# Patient Record
Sex: Male | Born: 1970 | Race: White | Hispanic: No | Marital: Married | Smoking: Former smoker
Health system: Southern US, Community
[De-identification: ages and names within clinical notes are randomized; demographics above are authoritative.]

## PROBLEM LIST (undated history)

## (undated) DIAGNOSIS — G43909 Migraine, unspecified, not intractable, without status migrainosus: Secondary | ICD-10-CM

## (undated) DIAGNOSIS — M5030 Other cervical disc degeneration, unspecified cervical region: Secondary | ICD-10-CM

## (undated) DIAGNOSIS — M4310 Spondylolisthesis, site unspecified: Secondary | ICD-10-CM

---

## 1998-07-08 ENCOUNTER — Emergency Department (HOSPITAL_COMMUNITY): Admission: EM | Admit: 1998-07-08 | Discharge: 1998-07-08 | Payer: Self-pay | Admitting: Emergency Medicine

## 2002-03-05 ENCOUNTER — Ambulatory Visit (HOSPITAL_COMMUNITY): Admission: RE | Admit: 2002-03-05 | Discharge: 2002-03-05 | Payer: Self-pay | Admitting: Internal Medicine

## 2002-03-05 ENCOUNTER — Encounter: Payer: Self-pay | Admitting: Internal Medicine

## 2002-10-18 ENCOUNTER — Emergency Department (HOSPITAL_COMMUNITY): Admission: EM | Admit: 2002-10-18 | Discharge: 2002-10-18 | Payer: Self-pay | Admitting: Emergency Medicine

## 2003-01-19 ENCOUNTER — Encounter: Admission: RE | Admit: 2003-01-19 | Discharge: 2003-01-19 | Payer: Self-pay | Admitting: Internal Medicine

## 2003-01-19 ENCOUNTER — Encounter: Payer: Self-pay | Admitting: Internal Medicine

## 2003-11-12 ENCOUNTER — Emergency Department (HOSPITAL_COMMUNITY): Admission: EM | Admit: 2003-11-12 | Discharge: 2003-11-13 | Payer: Self-pay | Admitting: Emergency Medicine

## 2004-06-30 ENCOUNTER — Ambulatory Visit: Payer: Self-pay | Admitting: Internal Medicine

## 2008-09-25 ENCOUNTER — Emergency Department (HOSPITAL_COMMUNITY): Admission: EM | Admit: 2008-09-25 | Discharge: 2008-09-25 | Payer: Self-pay | Admitting: *Deleted

## 2008-11-04 ENCOUNTER — Ambulatory Visit: Payer: Self-pay

## 2009-02-19 ENCOUNTER — Emergency Department: Payer: Self-pay | Admitting: Emergency Medicine

## 2009-06-23 ENCOUNTER — Emergency Department (HOSPITAL_COMMUNITY): Admission: EM | Admit: 2009-06-23 | Discharge: 2009-06-23 | Payer: Self-pay | Admitting: Emergency Medicine

## 2010-01-30 ENCOUNTER — Ambulatory Visit: Payer: Self-pay | Admitting: Neurosurgery

## 2010-11-05 ENCOUNTER — Emergency Department: Payer: Self-pay | Admitting: Emergency Medicine

## 2010-11-14 ENCOUNTER — Emergency Department: Payer: Self-pay | Admitting: Emergency Medicine

## 2010-11-17 ENCOUNTER — Ambulatory Visit: Payer: Self-pay | Admitting: Orthopaedic Surgery

## 2010-12-02 LAB — DIFFERENTIAL
Basophils Absolute: 0 10*3/uL (ref 0.0–0.1)
Basophils Relative: 1 % (ref 0–1)
Eosinophils Absolute: 0.2 10*3/uL (ref 0.0–0.7)
Eosinophils Relative: 3 % (ref 0–5)
Lymphocytes Relative: 29 % (ref 12–46)
Lymphs Abs: 2 10*3/uL (ref 0.7–4.0)
Monocytes Absolute: 0.5 10*3/uL (ref 0.1–1.0)
Monocytes Relative: 8 % (ref 3–12)
Neutro Abs: 4.2 10*3/uL (ref 1.7–7.7)
Neutrophils Relative %: 61 % (ref 43–77)

## 2010-12-02 LAB — CBC
HCT: 46.7 % (ref 39.0–52.0)
Hemoglobin: 16.3 g/dL (ref 13.0–17.0)
MCHC: 34.8 g/dL (ref 30.0–36.0)
MCV: 100.3 fL — ABNORMAL HIGH (ref 78.0–100.0)
Platelets: 199 10*3/uL (ref 150–400)
RBC: 4.66 MIL/uL (ref 4.22–5.81)
RDW: 12.7 % (ref 11.5–15.5)
WBC: 7 10*3/uL (ref 4.0–10.5)

## 2010-12-02 LAB — POCT I-STAT, CHEM 8
BUN: 9 mg/dL (ref 6–23)
Calcium, Ion: 1.12 mmol/L (ref 1.12–1.32)
Chloride: 103 mEq/L (ref 96–112)
Creatinine, Ser: 1.1 mg/dL (ref 0.4–1.5)
Glucose, Bld: 94 mg/dL (ref 70–99)
HCT: 50 % (ref 39.0–52.0)
Hemoglobin: 17 g/dL (ref 13.0–17.0)
Potassium: 3.9 mEq/L (ref 3.5–5.1)
Sodium: 140 mEq/L (ref 135–145)
TCO2: 28 mmol/L (ref 0–100)

## 2010-12-02 LAB — SEDIMENTATION RATE: Sed Rate: 2 mm/hr (ref 0–16)

## 2012-06-06 ENCOUNTER — Ambulatory Visit: Payer: Self-pay | Admitting: Pain Medicine

## 2012-06-06 ENCOUNTER — Ambulatory Visit: Payer: Self-pay

## 2014-02-18 ENCOUNTER — Emergency Department (HOSPITAL_COMMUNITY): Payer: 59

## 2014-02-18 ENCOUNTER — Emergency Department (HOSPITAL_COMMUNITY)
Admission: EM | Admit: 2014-02-18 | Discharge: 2014-02-18 | Disposition: A | Discharge status: Home / Self Care | Payer: 59 | Attending: Emergency Medicine | Admitting: Emergency Medicine

## 2014-02-18 ENCOUNTER — Encounter (HOSPITAL_COMMUNITY): Payer: Self-pay | Admitting: Emergency Medicine

## 2014-02-18 DIAGNOSIS — Z79899 Other long term (current) drug therapy: Secondary | ICD-10-CM | POA: Insufficient documentation

## 2014-02-18 DIAGNOSIS — F17200 Nicotine dependence, unspecified, uncomplicated: Secondary | ICD-10-CM | POA: Insufficient documentation

## 2014-02-18 DIAGNOSIS — IMO0002 Reserved for concepts with insufficient information to code with codable children: Secondary | ICD-10-CM | POA: Insufficient documentation

## 2014-02-18 DIAGNOSIS — S0990XA Unspecified injury of head, initial encounter: Secondary | ICD-10-CM

## 2014-02-18 DIAGNOSIS — Y9389 Activity, other specified: Secondary | ICD-10-CM | POA: Insufficient documentation

## 2014-02-18 DIAGNOSIS — M5030 Other cervical disc degeneration, unspecified cervical region: Secondary | ICD-10-CM | POA: Insufficient documentation

## 2014-02-18 DIAGNOSIS — W010XXA Fall on same level from slipping, tripping and stumbling without subsequent striking against object, initial encounter: Secondary | ICD-10-CM | POA: Insufficient documentation

## 2014-02-18 DIAGNOSIS — S161XXA Strain of muscle, fascia and tendon at neck level, initial encounter: Secondary | ICD-10-CM

## 2014-02-18 DIAGNOSIS — G43909 Migraine, unspecified, not intractable, without status migrainosus: Secondary | ICD-10-CM | POA: Insufficient documentation

## 2014-02-18 DIAGNOSIS — Y9289 Other specified places as the place of occurrence of the external cause: Secondary | ICD-10-CM | POA: Insufficient documentation

## 2014-02-18 DIAGNOSIS — S139XXA Sprain of joints and ligaments of unspecified parts of neck, initial encounter: Secondary | ICD-10-CM | POA: Insufficient documentation

## 2014-02-18 HISTORY — DX: Migraine, unspecified, not intractable, without status migrainosus: G43.909

## 2014-02-18 HISTORY — DX: Other cervical disc degeneration, unspecified cervical region: M50.30

## 2014-02-18 MED ORDER — CYCLOBENZAPRINE HCL 10 MG PO TABS: 10.0000 mg | ORAL_TABLET | Freq: Two times a day (BID) | ORAL | Status: DC | PRN

## 2014-02-18 MED ORDER — HYDROMORPHONE HCL PF 1 MG/ML IJ SOLN
1.0000 mg | Freq: Once | INTRAMUSCULAR | Status: AC
Start: 2014-02-18 — End: 2014-02-18
  Administered 2014-02-18: 1 mg via INTRAVENOUS
  Filled 2014-02-18: qty 1

## 2014-02-18 MED ORDER — HYDROMORPHONE HCL PF 1 MG/ML IJ SOLN
0.5000 mg | Freq: Once | INTRAMUSCULAR | Status: AC
  Administered 2014-02-18: 0.5 mg via INTRAVENOUS
  Filled 2014-02-18: qty 1

## 2014-02-18 MED ORDER — OXYCODONE-ACETAMINOPHEN 5-325 MG PO TABS: 1.0000 | ORAL_TABLET | ORAL | Status: AC | PRN

## 2014-02-18 MED ORDER — OXYCODONE-ACETAMINOPHEN 5-325 MG PO TABS: 1.0000 | ORAL_TABLET | ORAL | Status: DC | PRN

## 2014-02-18 MED ORDER — CYCLOBENZAPRINE HCL 10 MG PO TABS: 10.0000 mg | ORAL_TABLET | Freq: Two times a day (BID) | ORAL | Status: AC | PRN

## 2014-02-18 MED ORDER — KETOROLAC TROMETHAMINE 30 MG/ML IJ SOLN
30.0000 mg | Freq: Once | INTRAMUSCULAR | Status: AC
  Administered 2014-02-18: 30 mg via INTRAVENOUS
  Filled 2014-02-18: qty 1

## 2014-02-18 NOTE — ED Notes (Signed)
Pt returned from x-ray, placed back on cardiac monitor, b/p and pulse ox.  Wife at bedside.

## 2014-02-18 NOTE — ED Notes (Signed)
CT notified, pt available for transport.

## 2014-02-18 NOTE — ED Notes (Signed)
No reaction from IV meds, Pt discharged via wheelchair home with wife.

## 2014-02-18 NOTE — Discharge Instructions (Signed)
Take pain medications as prescribed. Stretch. Ice and heat. Your x-rays and CTs are all normal today. Please followup with a primary care doctor on Friday as scheduled. Return if any worsening symptoms, numbness or weakness in extremities, any new concerning symptoms.   Cervical Sprain A cervical sprain is an injury in the neck in which the strong, fibrous tissues (ligaments) that connect your neck bones stretch or tear. Cervical sprains can range from mild to severe. Severe cervical sprains can cause the neck vertebrae to be unstable. This can lead to damage of the spinal cord and can result in serious nervous system problems. The amount of time it takes for a cervical sprain to get better depends on the cause and extent of the injury. Most cervical sprains heal in 1 to 3 weeks. CAUSES  Severe cervical sprains may be caused by:   Contact sport injuries (such as from football, rugby, wrestling, hockey, auto racing, gymnastics, diving, martial arts, or boxing).   Motor vehicle collisions.   Whiplash injuries. This is an injury from a sudden forward and backward whipping movement of the head and neck.  Falls.  Mild cervical sprains may be caused by:   Being in an awkward position, such as while cradling a telephone between your ear and shoulder.   Sitting in a chair that does not offer proper support.   Working at a poorly Landscape architect station.   Looking up or down for long periods of time.  SYMPTOMS   Pain, soreness, stiffness, or a burning sensation in the front, back, or sides of the neck. This discomfort may develop immediately after the injury or slowly, 24 hours or more after the injury.   Pain or tenderness directly in the middle of the back of the neck.   Shoulder or upper back pain.   Limited ability to move the neck.   Headache.   Dizziness.   Weakness, numbness, or tingling in the hands or arms.   Muscle spasms.   Difficulty swallowing or  chewing.   Tenderness and swelling of the neck.  DIAGNOSIS  Most of the time your health care provider can diagnose a cervical sprain by taking your history and doing a physical exam. Your health care provider will ask about previous neck injuries and any known neck problems, such as arthritis in the neck. X-rays may be taken to find out if there are any other problems, such as with the bones of the neck. Other tests, such as a CT scan or MRI, may also be needed.  TREATMENT  Treatment depends on the severity of the cervical sprain. Mild sprains can be treated with rest, keeping the neck in place (immobilization), and pain medicines. Severe cervical sprains are immediately immobilized. Further treatment is done to help with pain, muscle spasms, and other symptoms and may include:  Medicines, such as pain relievers, numbing medicines, or muscle relaxants.   Physical therapy. This may involve stretching exercises, strengthening exercises, and posture training. Exercises and improved posture can help stabilize the neck, strengthen muscles, and help stop symptoms from returning.  HOME CARE INSTRUCTIONS   Put ice on the injured area.   Put ice in a plastic bag.   Place a towel between your skin and the bag.   Leave the ice on for 15-20 minutes, 3-4 times a day.   If your injury was severe, you may have been given a cervical collar to wear. A cervical collar is a two-piece collar designed to keep your neck  from moving while it heals.  Do not remove the collar unless instructed by your health care provider.  If you have long hair, keep it outside of the collar.  Ask your health care provider before making any adjustments to your collar. Minor adjustments may be required over time to improve comfort and reduce pressure on your chin or on the back of your head.  Ifyou are allowed to remove the collar for cleaning or bathing, follow your health care provider's instructions on how to do so  safely.  Keep your collar clean by wiping it with mild soap and water and drying it completely. If the collar you have been given includes removable pads, remove them every 1-2 days and hand wash them with soap and water. Allow them to air dry. They should be completely dry before you wear them in the collar.  If you are allowed to remove the collar for cleaning and bathing, wash and dry the skin of your neck. Check your skin for irritation or sores. If you see any, tell your health care provider.  Do not drive while wearing the collar.   Only take over-the-counter or prescription medicines for pain, discomfort, or fever as directed by your health care provider.   Keep all follow-up appointments as directed by your health care provider.   Keep all physical therapy appointments as directed by your health care provider.   Make any needed adjustments to your workstation to promote good posture.   Avoid positions and activities that make your symptoms worse.   Warm up and stretch before being active to help prevent problems.  SEEK MEDICAL CARE IF:   Your pain is not controlled with medicine.   You are unable to decrease your pain medicine over time as planned.   Your activity level is not improving as expected.  SEEK IMMEDIATE MEDICAL CARE IF:   You develop any bleeding.  You develop stomach upset.  You have signs of an allergic reaction to your medicine.   Your symptoms get worse.   You develop new, unexplained symptoms.   You have numbness, tingling, weakness, or paralysis in any part of your body.  MAKE SURE YOU:   Understand these instructions.  Will watch your condition.  Will get help right away if you are not doing well or get worse. Document Released: 06/12/2007 Document Revised: 08/20/2013 Document Reviewed: 02/20/2013 [facility] Patient Information 2015 [place], [place]. This information is not intended to replace advice given to you by your health care  provider. Make sure you discuss any questions you have with your health care provider.  Concussion A concussion, or closed-head injury, is a brain injury caused by a direct blow to the head or by a quick and sudden movement (jolt) of the head or neck. Concussions are usually not life-threatening. Even so, the effects of a concussion can be serious. If you have had a concussion before, you are more likely to experience concussion-like symptoms after a direct blow to the head.  CAUSES  Direct blow to the head, such as from running into another player during a soccer game, being hit in a fight, or hitting your head on a hard surface.  A jolt of the head or neck that causes the brain to move back and forth inside the skull, such as in a car crash. SIGNS AND SYMPTOMS The signs of a concussion can be hard to notice. Early on, they may be missed by you, family members, and health care providers. You  may look fine but act or feel differently. Symptoms are usually temporary, but they may last for days, weeks, or even longer. Some symptoms may appear right away while others may not show up for hours or days. Every head injury is different. Symptoms include:  Mild to moderate headaches that will not go away.  A feeling of pressure inside your head.  Having more trouble than usual:  Learning or remembering things you have heard.  Answering questions.  Paying attention or concentrating.  Organizing daily tasks.  Making decisions and solving problems.  Slowness in thinking, acting or reacting, speaking, or reading.  Getting lost or being easily confused.  Feeling tired all the time or lacking energy (fatigued).  Feeling drowsy.  Sleep disturbances.  Sleeping more than usual.  Sleeping less than usual.  Trouble falling asleep.  Trouble sleeping (insomnia).  Loss of balance or feeling lightheaded or dizzy.  Nausea or vomiting.  Numbness or tingling.  Increased sensitivity  to:  Sounds.  Lights.  Distractions.  Vision problems or eyes that tire easily.  Diminished sense of taste or smell.  Ringing in the ears.  Mood changes such as feeling sad or anxious.  Becoming easily irritated or angry for little or no reason.  Lack of motivation.  Seeing or hearing things other people do not see or hear (hallucinations). DIAGNOSIS Your health care provider can usually diagnose a concussion based on a description of your injury and symptoms. He or she will ask whether you passed out (lost consciousness) and whether you are having trouble remembering events that happened right before and during your injury. Your evaluation might include:  A brain scan to look for signs of injury to the brain. Even if the test shows no injury, you may still have a concussion.  Blood tests to be sure other problems are not present. TREATMENT  Concussions are usually treated in an emergency department, in urgent care, or at a clinic. You may need to stay in the hospital overnight for further treatment.  Tell your health care provider if you are taking any medicines, including prescription medicines, over-the-counter medicines, and natural remedies. Some medicines, such as blood thinners (anticoagulants) and aspirin, may increase the chance of complications. Also tell your health care provider whether you have had alcohol or are taking illegal drugs. This information may affect treatment.  Your health care provider will send you home with important instructions to follow.  How fast you will recover from a concussion depends on many factors. These factors include how severe your concussion is, what part of your brain was injured, your age, and how healthy you were before the concussion.  Most people with mild injuries recover fully. Recovery can take time. In general, recovery is slower in older persons. Also, persons who have had a concussion in the past or have other medical  problems may find that it takes longer to recover from their current injury. HOME CARE INSTRUCTIONS General Instructions  Carefully follow the directions your health care provider gave you.  Only take over-the-counter or prescription medicines for pain, discomfort, or fever as directed by your health care provider.  Take only those medicines that your health care provider has approved.  Do not drink alcohol until your health care provider says you are well enough to do so. Alcohol and certain other drugs may slow your recovery and can put you at risk of further injury.  If it is harder than usual to remember things, write them down.  If you are easily distracted, try to do one thing at a time. For example, do not try to watch TV while fixing dinner.  Talk with family members or close friends when making important decisions.  Keep all follow-up appointments. Repeated evaluation of your symptoms is recommended for your recovery.  Watch your symptoms and tell others to do the same. Complications sometimes occur after a concussion. Older adults with a brain injury may have a higher risk of serious complications, such as a blood clot on the brain.  Tell your teachers, school nurse, school counselor, coach, athletic trainer, or work Freight forwarder about your injury, symptoms, and restrictions. Tell them about what you can or cannot do. They should watch for:  Increased problems with attention or concentration.  Increased difficulty remembering or learning new information.  Increased time needed to complete tasks or assignments.  Increased irritability or decreased ability to cope with stress.  Increased symptoms.  Rest. Rest helps the brain to heal. Make sure you:  Get plenty of sleep at night. Avoid staying up late at night.  Keep the same bedtime hours on weekends and weekdays.  Rest during the day. Take daytime naps or rest breaks when you feel tired.  Limit activities that require a  lot of thought or concentration. These include:  Doing homework or job-related work.  Watching TV.  Working on the computer.  Avoid any situation where there is potential for another head injury (football, hockey, soccer, basketball, martial arts, downhill snow sports and horseback riding). Your condition will get worse every time you experience a concussion. You should avoid these activities until you are evaluated by the appropriate follow-up health care providers. Returning To Your Regular Activities You will need to return to your normal activities slowly, not all at once. You must give your body and brain enough time for recovery.  Do not return to sports or other athletic activities until your health care provider tells you it is safe to do so.  Ask your health care provider when you can drive, ride a bicycle, or operate heavy machinery. Your ability to react may be slower after a brain injury. Never do these activities if you are dizzy.  Ask your health care provider about when you can return to work or school. Preventing Another Concussion It is very important to avoid another brain injury, especially before you have recovered. In rare cases, another injury can lead to permanent brain damage, brain swelling, or death. The risk of this is greatest during the first 7-10 days after a head injury. Avoid injuries by:  Wearing a seat belt when riding in a car.  Drinking alcohol only in moderation.  Wearing a helmet when biking, skiing, skateboarding, skating, or doing similar activities.  Avoiding activities that could lead to a second concussion, such as contact or recreational sports, until your health care provider says it is okay.  Taking safety measures in your home.  Remove clutter and tripping hazards from floors and stairways.  Use grab bars in bathrooms and handrails by stairs.  Place non-slip mats on floors and in bathtubs.  Improve lighting in dim areas. SEEK MEDICAL  CARE IF:  You have increased problems paying attention or concentrating.  You have increased difficulty remembering or learning new information.  You need more time to complete tasks or assignments than before.  You have increased irritability or decreased ability to cope with stress.  You have more symptoms than before. Seek medical care if you have any  of the following symptoms for more than 2 weeks after your injury:  Lasting (chronic) headaches.  Dizziness or balance problems.  Nausea.  Vision problems.  Increased sensitivity to noise or light.  Depression or mood swings.  Anxiety or irritability.  Memory problems.  Difficulty concentrating or paying attention.  Sleep problems.  Feeling tired all the time. SEEK IMMEDIATE MEDICAL CARE IF:  You have severe or worsening headaches. These may be a sign of a blood clot in the brain.  You have weakness (even if only in one hand, leg, or part of the face).  You have numbness.  You have decreased coordination.  You vomit repeatedly.  You have increased sleepiness.  One pupil is larger than the other.  You have convulsions.  You have slurred speech.  You have increased confusion. This may be a sign of a blood clot in the brain.  You have increased restlessness, agitation, or irritability.  You are unable to recognize people or places.  You have neck pain.  It is difficult to wake you up.  You have unusual behavior changes.  You lose consciousness. MAKE SURE YOU:  Understand these instructions.  Will watch your condition.  Will get help right away if you are not doing well or get worse. Document Released: 11/05/2003 Document Revised: 08/20/2013 Document Reviewed: 03/07/2013 [facility] Patient Information 2015 [place], [place]. This information is not intended to replace advice given to you by your health care provider. Make sure you discuss any questions you have with your health care provider.
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
## 2014-02-18 NOTE — ED Provider Notes (Signed)
Medical screening examination/treatment/procedure(s) were performed by non-physician practitioner and as supervising physician I was immediately available for consultation/collaboration.    Dot Lanes, MD 02/18/14 [phone]

## 2014-04-02 ENCOUNTER — Ambulatory Visit (INDEPENDENT_AMBULATORY_CARE_PROVIDER_SITE_OTHER): Payer: Commercial Managed Care - PPO | Admitting: Family Medicine

## 2014-04-02 ENCOUNTER — Emergency Department (HOSPITAL_COMMUNITY)
Admission: EM | Admit: 2014-04-02 | Discharge: 2014-04-02 | Disposition: A | Discharge status: Home / Self Care | Payer: 59 | Attending: Emergency Medicine | Admitting: Emergency Medicine

## 2014-04-02 ENCOUNTER — Encounter (HOSPITAL_COMMUNITY): Payer: Self-pay | Admitting: Emergency Medicine

## 2014-04-02 ENCOUNTER — Encounter: Payer: Self-pay | Admitting: Family Medicine

## 2014-04-02 ENCOUNTER — Other Ambulatory Visit: Payer: Self-pay

## 2014-04-02 ENCOUNTER — Emergency Department (HOSPITAL_COMMUNITY): Payer: 59

## 2014-04-02 ENCOUNTER — Ambulatory Visit (INDEPENDENT_AMBULATORY_CARE_PROVIDER_SITE_OTHER): Payer: Commercial Managed Care - PPO

## 2014-04-02 VITALS — BP 108/64 | HR 64 | Temp 97.8°F | Resp 18

## 2014-04-02 DIAGNOSIS — Z8679 Personal history of other diseases of the circulatory system: Secondary | ICD-10-CM | POA: Insufficient documentation

## 2014-04-02 DIAGNOSIS — R079 Chest pain, unspecified: Secondary | ICD-10-CM | POA: Insufficient documentation

## 2014-04-02 DIAGNOSIS — R071 Chest pain on breathing: Secondary | ICD-10-CM | POA: Insufficient documentation

## 2014-04-02 DIAGNOSIS — R0789 Other chest pain: Secondary | ICD-10-CM

## 2014-04-02 DIAGNOSIS — Z8739 Personal history of other diseases of the musculoskeletal system and connective tissue: Secondary | ICD-10-CM | POA: Insufficient documentation

## 2014-04-02 DIAGNOSIS — Z79899 Other long term (current) drug therapy: Secondary | ICD-10-CM | POA: Insufficient documentation

## 2014-04-02 DIAGNOSIS — Z87891 Personal history of nicotine dependence: Secondary | ICD-10-CM | POA: Insufficient documentation

## 2014-04-02 DIAGNOSIS — R0781 Pleurodynia: Secondary | ICD-10-CM

## 2014-04-02 LAB — CBC
HCT: 46.1 % (ref 39.0–52.0)
Hemoglobin: 15.9 g/dL (ref 13.0–17.0)
MCH: 35 pg — ABNORMAL HIGH (ref 26.0–34.0)
MCHC: 34.5 g/dL (ref 30.0–36.0)
MCV: 101.5 fL — ABNORMAL HIGH (ref 78.0–100.0)
PLATELETS: 216 10*3/uL (ref 150–400)
RBC: 4.54 MIL/uL (ref 4.22–5.81)
RDW: 13 % (ref 11.5–15.5)
WBC: 8.9 10*3/uL (ref 4.0–10.5)

## 2014-04-02 LAB — BASIC METABOLIC PANEL
ANION GAP: 15 (ref 5–15)
BUN: 14 mg/dL (ref 6–23)
CHLORIDE: 100 meq/L (ref 96–112)
CO2: 26 mEq/L (ref 19–32)
CREATININE: 1.01 mg/dL (ref 0.50–1.35)
Calcium: 9.9 mg/dL (ref 8.4–10.5)
GFR calc non Af Amer: >90 mL/min (ref >90)
Glucose, Bld: 117 mg/dL — ABNORMAL HIGH (ref 70–99)
Potassium: 4.5 mEq/L (ref 3.7–5.3)
SODIUM: 141 meq/L (ref 137–147)

## 2014-04-02 LAB — D-DIMER, QUANTITATIVE: D-Dimer, Quant: <0.27 ug/mL-FEU (ref 0.00–0.48)

## 2014-04-02 LAB — I-STAT TROPONIN, ED: TROPONIN I, POC: 0 ng/mL (ref 0.00–0.08)

## 2014-04-02 LAB — PRO B NATRIURETIC PEPTIDE: PRO B NATRI PEPTIDE: 52.2 pg/mL (ref 0–125)

## 2014-04-02 MED ORDER — HYDROCODONE-ACETAMINOPHEN 5-325 MG PO TABS: 1.0000 | ORAL_TABLET | Freq: Four times a day (QID) | ORAL | Status: DC | PRN

## 2014-04-02 MED ORDER — ONDANSETRON HCL 4 MG PO TABS: 4.0000 mg | ORAL_TABLET | Freq: Four times a day (QID) | ORAL | Status: DC

## 2014-04-02 NOTE — ED Provider Notes (Signed)
CSN: [id number]     Arrival date & time 04/02/14  7741 History   First MD Initiated Contact with Patient 04/02/14 1005     Chief Complaint  Patient presents with  . Chest Pain     (Consider location/radiation/quality/duration/timing/severity/associated sxs/prior Treatment) HPI Comments: Patient is a 43 year old male with history of degenerative disc disease and migraines who presents to the emergency department today with chest pain and shortness of breath. He reports that it has been ongoing for the past 5 days. He states that he was stretching when he heard something crack in his chest. Since that time he has had a sharp pain, worse with inspiration. The pain is also worse with certain movements. He states that leaning forward it improves his pain. The pain is worse at night. He is taking Tylenol with minimal relief of his symptoms. He's had pain like this in the past, but never pain that has lasted this long. No nausea, vomiting, diaphoresis. He is a former smoker. No prior history of DVT or PE. No long trips or recent surgery.   The history is provided by the patient. No language interpreter was used.    Past Medical History  Diagnosis Date  . Degenerative disc disease, cervical   . Migraines    History reviewed. No pertinent past surgical history. No family history on file. History  Substance Use Topics  . Smoking status: Former Research scientist (life sciences)  . Smokeless tobacco: Never Used  . Alcohol Use: No    Review of Systems  Constitutional: Negative for fever, chills and diaphoresis.  Respiratory: Positive for shortness of breath.   Cardiovascular: Positive for chest pain. Negative for palpitations and leg swelling.  Gastrointestinal: Negative for nausea and vomiting.  All other systems reviewed and are negative.     Allergies  Solu-medrol  Home Medications   Prior to Admission medications   Medication Sig Start Date End Date Taking? Authorizing Provider  acetaminophen (TYLENOL) 325  MG tablet Take 650 mg by mouth every 6 (six) hours as needed for mild pain, fever or headache.   Yes Historical Provider, MD  diazepam (VALIUM) 10 MG tablet Take 10 mg by mouth daily.   Yes Historical Provider, MD  esomeprazole (NEXIUM) 40 MG capsule Take 40 mg by mouth daily at 12 noon.   Yes Historical Provider, MD  fentaNYL (DURAGESIC - DOSED MCG/HR) 25 MCG/HR patch Place 25 mcg onto the skin every 3 (three) days.   Yes Historical Provider, MD  FOLIC ACID PO Take 1 tablet by mouth daily.   Yes Historical Provider, MD  Methotrexate Sodium, PF, 50 MG/2ML SOLN Inject 0.4 mLs as directed every Friday.   Yes Historical Provider, MD  Olopatadine HCl (PATANASE) 0.6 % SOLN Place 1 drop into the nose daily as needed (allergies).   Yes Historical Provider, MD  promethazine (PHENERGAN) 25 MG tablet Take 25 mg by mouth every 6 (six) hours as needed for vomiting (nausea due to migraine).   Yes Historical Provider, MD  PROMETHAZINE HCL RE Place 1 suppository rectally every 8 (eight) hours as needed (nausea due to migraine).   Yes Historical Provider, MD  SUMAtriptan Succinate Refill (IMITREX STATDOSE REFILL) 6 MG/0.5ML SOCT Inject 6 mg into the skin daily as needed (migraines).    Yes Historical Provider, MD  tiZANidine (ZANAFLEX) 4 MG tablet Take 4 mg by mouth 2 (two) times daily.   Yes Historical Provider, MD  vitamin D, CHOLECALCIFEROL, 400 UNITS tablet Take 400 Units by mouth daily.   Yes  Historical Provider, MD   BP 117/82  Pulse 64  Temp(Src) 97.7 F (36.5 C) (Oral)  Resp 24  Ht 6' (1.829 m)  Wt 155 lb (70.308 kg)  BMI 21.02 kg/m2  SpO2 100% Physical Exam  Nursing note and vitals reviewed. Constitutional: He is oriented to person, place, and time. He appears well-developed and well-nourished. No distress.  HENT:  Head: Normocephalic and atraumatic.  Right Ear: External ear normal.  Left Ear: External ear normal.  Nose: Nose normal.  Eyes: Conjunctivae are normal.  Neck: Normal range of  motion. No tracheal deviation present.  Cardiovascular: Normal rate, regular rhythm, normal heart sounds, intact distal pulses and normal pulses.   Pulses:      Radial pulses are 2+ on the right side, and 2+ on the left side.       Posterior tibial pulses are 2+ on the right side, and 2+ on the left side.  Pulmonary/Chest: Effort normal and breath sounds normal. No stridor. He exhibits no tenderness and no bony tenderness.  Abdominal: Soft. He exhibits no distension. There is no tenderness.  Musculoskeletal: Normal range of motion.       Back:  Neurological: He is alert and oriented to person, place, and time.  Skin: Skin is warm and dry. He is not diaphoretic.  Psychiatric: He has a normal mood and affect. His behavior is normal.    ED Course  Procedures (including critical care time) Labs Review Labs Reviewed  CBC - Abnormal; Notable for the following:    MCV 101.5 (*)    MCH 35.0 (*)    All other components within normal limits  BASIC METABOLIC PANEL - Abnormal; Notable for the following:    Glucose, Bld 117 (*)    All other components within normal limits  PRO B NATRIURETIC PEPTIDE  D-DIMER, QUANTITATIVE  I-STAT TROPOININ, ED    Imaging Review Dg Chest 2 View  04/02/2014   CLINICAL DATA:  Cough and left-sided chest pain.  EXAM: CHEST  2 VIEW  COMPARISON:  06/24/2011.  FINDINGS: Mediastinum and hilar structures are unremarkable. Lungs are clear of acute infiltrates. No pleural effusion or pneumothorax. Several punctate scattered calcific pulmonary nodules noted consistent with prior granulomas disease. No pleural effusion or pneumothorax. No acute bony abnormality.  IMPRESSION: No active cardiopulmonary disease.   Electronically Signed   By: [place]   On: 04/02/2014 11:06   Dg Chest Port 1 View  04/02/2014   CLINICAL DATA:  Chest pain.  EXAM: PORTABLE CHEST - 1 VIEW  COMPARISON:  Chest x-ray 02/18/2014.  FINDINGS: Lung volumes are normal. No consolidative airspace  disease. No pleural effusions. No pneumothorax. No pulmonary nodule or mass noted. Pulmonary vasculature and the cardiomediastinal silhouette are within normal limits.  IMPRESSION: No radiographic evidence of acute cardiopulmonary disease.   Electronically Signed   By: Vinnie Langton M.D.   On: 04/02/2014 10:20     EKG Interpretation None      MDM   Final diagnoses:  Pleuritic pain    Patient presents to ED with 5 days of worsening pleuritic pain. Patient with negative d dimer. PERC negative. Likely costochondritis. Patient cannot take NSAIDs with methotrexate. Will give narcotic pain medications. Discussed reasons to return to ED immediately. Discussed case with Dr. Rogene Houston who agrees with plan. Vital signs stable for discharge. Patient / Family / Caregiver informed of clinical course, understand medical decision-making process, and agree with plan.  Elwyn Lade, PA-C 04/02/14 [phone]

## 2014-04-02 NOTE — Progress Notes (Signed)
[facility] [address], [place] [address] [phone]  Date:  04/02/2014   Name:  Andres Ray   DOB:  1971/01/17   MRN:  [id number]  PCP:  No PCP Per Patient    Chief Complaint: Chest Pain and Shortness of Breath   History of Present Illness:  Andres Ray is a 43 y.o. very pleasant male patient who presents with the following:  Here today with complaint of cough and left sided CP.  Suspects that he has pneumonia. He states he has noted "constant chest pain, like a knife in my lung" for about 5 days.  He also notes SOB which is worse in the am.  Laying flat seems to help.  He may cough occasionally.  The cough is non- productive.  He has pain with sneezing.   He has not noted a fever, but has noted some chills.   He has noted a little bit of sinus congestion, but no other URI sx  [place]- he was seen at [facility].  [facility]- had x-rays of his lumbar and thoracic spine, and a CT of his head and neck.  At that tie he felt that his chest "popped."  However he felt that his chest was getting better until a few days ago when his pain worsened   His PCP in is [place].   He is on Fentanyl for chronic back pain  There are no active problems to display for this patient.   Past Medical History  Diagnosis Date  . Degenerative disc disease, cervical   . Migraines     No past surgical history on file.  History  Substance Use Topics  . Smoking status: Former Research scientist (life sciences)  . Smokeless tobacco: Never Used  . Alcohol Use: No    No family history on file.  No Known Allergies  Medication list has been reviewed and updated.  Current Outpatient Prescriptions on File Prior to Visit  Medication Sig Dispense Refill  . diazepam (VALIUM) 5 MG tablet Take 5 mg by mouth every morning.      Marland Kitchen esomeprazole (NEXIUM) 40 MG capsule Take 40 mg by mouth daily at 12 noon.      . fentaNYL (DURAGESIC - DOSED MCG/HR) 25 MCG/HR patch Place 25 mcg  onto the skin every 3 (three) days.      Marland Kitchen FOLIC ACID PO Take 1 tablet by mouth daily.      . Methotrexate Sodium, PF, 50 MG/2ML SOLN Inject 0.4 mLs as directed every Friday.      . Olopatadine HCl (PATANASE) 0.6 % SOLN Place 1 drop into the nose daily as needed (allergies).      . Pramoxine-Calamine (CALADRYL EX) Apply 1 application topically 2 (two) times daily as needed (poison ivy).      Marland Kitchen PROMETHAZINE HCL RE Place 1 suppository rectally every 8 (eight) hours as needed (nausea due to migraine).      . SUMAtriptan Succinate Refill (IMITREX STATDOSE REFILL) 6 MG/0.5ML SOCT Inject 1 each into the skin daily as needed (migraines).      Marland Kitchen tiZANidine (ZANAFLEX) 4 MG tablet Take 4 mg by mouth 2 (two) times daily.      Marland Kitchen VITAMIN D, CHOLECALCIFEROL, PO Take 1 tablet by mouth daily.       No current facility-administered medications on file prior to visit.    Review of Systems:  As per HPI- otherwise  negative.   Physical Examination: Filed Vitals:   04/02/14 0851  BP: 108/64  Pulse: 64  Temp: 97.8 F (36.6 C)  Resp: 18   There were no vitals filed for this visit. There is no weight on file to calculate BMI. Ideal Body Weight:    GEN: WDWN, NAD, Non-toxic, A & O x 3.  Thin build,is protecting the left side of his trunk by learning his upper body to the left HEENT: Atraumatic, Normocephalic. Neck supple. No masses, No LAD. Ears and Nose: No external deformity. CV: RRR, No M/G/R. No JVD. No thrill. No extra heart sounds. PULM: CTA B, no wheezes, crackles, rhonchi. No retractions. No resp. distress. No accessory muscle use. ABD: S,  ND, +BS. No rebound. No HSM.  Diffuse mild tenderness  EXTR: No c/c/e NEURO Normal gait.  PSYCH: Normally interactive. Conversant. Not depressed or anxious appearing.  Calm demeanor.  He has complaint of tenderness to palpation in his chest wall and back  EKG: NSR, no ST elevation or depression  [facility] reading (PRIMARY) by  Dr. Lorelei Pont. CXR:  negative   Assessment and Plan: Other chest pain - Plan: DG Chest 2 View, EKG 12-Lead  Here today with chest pain and SOB.  Likely MSK in origin, and our evlaution here today is benign so far.   However, cannot ensure he does not have any dangerous patholgoy here in clinic tody.  Recommended that we transfer him to the ER via EMS.  He refuses this, but will drive himself to the ER.    Signed Lamar Blinks, MD

## 2014-04-02 NOTE — ED Notes (Signed)
Patient presents today with a chief complaint of central chest pain with shortness of breath and "stabbing" sensation to both lungs. Patient reports productive cough x 5 days with yellow sputum and states he feels better when leaning forward.

## 2014-04-02 NOTE — Discharge Instructions (Signed)
Costochondritis Costochondritis, sometimes called Tietze syndrome, is a swelling and irritation (inflammation) of the tissue (cartilage) that connects your ribs with your breastbone (sternum). It causes pain in the chest and rib area. Costochondritis usually goes away on its own over time. It can take up to 6 weeks or longer to get better, especially if you are unable to limit your activities. CAUSES  Some cases of costochondritis have no known cause. Possible causes include:  Injury (trauma).  Exercise or activity such as lifting.  Severe coughing. SIGNS AND SYMPTOMS  Pain and tenderness in the chest and rib area.  Pain that gets worse when coughing or taking deep breaths.  Pain that gets worse with specific movements. DIAGNOSIS  Your health care provider will do a physical exam and ask about your symptoms. Chest X-rays or other tests may be done to rule out other problems. TREATMENT  Costochondritis usually goes away on its own over time. Your health care provider may prescribe medicine to help relieve pain. HOME CARE INSTRUCTIONS   Avoid exhausting physical activity. Try not to strain your ribs during normal activity. This would include any activities using chest, abdominal, and side muscles, especially if heavy weights are used.  Apply ice to the affected area for the first 2 days after the pain begins.  Put ice in a plastic bag.  Place a towel between your skin and the bag.  Leave the ice on for 20 minutes, 2-3 times a day.  Only take over-the-counter or prescription medicines as directed by your health care provider. SEEK MEDICAL CARE IF:  You have redness or swelling at the rib joints. These are signs of infection.  Your pain does not go away despite rest or medicine. SEEK IMMEDIATE MEDICAL CARE IF:   Your pain increases or you are very uncomfortable.  You have shortness of breath or difficulty breathing.  You cough up blood.  You have worse chest pains,  sweating, or vomiting.  You have a fever or persistent symptoms for more than 2-3 days.  You have a fever and your symptoms suddenly get worse. MAKE SURE YOU:   Understand these instructions.  Will watch your condition.  Will get help right away if you are not doing well or get worse. Document Released: 05/25/2005 Document Revised: 06/05/2013 Document Reviewed: 03/19/2013 ExitCare Patient Information 2015 [employer]. This information is not intended to replace advice given to you by your health care provider. Make sure you discuss any questions you have with your health care provider.  

## 2014-04-02 NOTE — Patient Instructions (Signed)
Please proceed to the nearest ER for further evaluation.

## 2014-04-03 NOTE — Addendum Note (Signed)
Addended by: Lamar Blinks C on: 04/03/2014 01:07 PM   Modules accepted: Level of Service

## 2014-04-04 NOTE — ED Provider Notes (Signed)
Medical screening examination/treatment/procedure(s) were performed by non-physician practitioner and as supervising physician I was immediately available for consultation/collaboration.   EKG Interpretation   Date/Time:  Wednesday April 02 2014 09:47:43 EDT Ventricular Rate:  62 PR Interval:  146 QRS Duration: 84 QT Interval:  372 QTC Calculation: 377 R Axis:   87 Text Interpretation:  Normal sinus rhythm Normal ECG ED PHYSICIAN  INTERPRETATION AVAILABLE IN [facility] Confirmed by TEST, Record  ([id number]) on 04/04/2014 7:19:32 AM        Fredia Sorrow, MD 04/04/14 1328

## 2015-11-06 DIAGNOSIS — M545 Low back pain: Secondary | ICD-10-CM | POA: Diagnosis not present

## 2015-11-06 DIAGNOSIS — Z79899 Other long term (current) drug therapy: Secondary | ICD-10-CM | POA: Diagnosis not present

## 2015-11-06 DIAGNOSIS — K219 Gastro-esophageal reflux disease without esophagitis: Secondary | ICD-10-CM | POA: Diagnosis not present

## 2015-11-06 DIAGNOSIS — M542 Cervicalgia: Secondary | ICD-10-CM | POA: Diagnosis not present

## 2016-01-21 DIAGNOSIS — M2580 Other specified joint disorders, unspecified joint: Secondary | ICD-10-CM | POA: Diagnosis not present

## 2016-01-21 DIAGNOSIS — W57XXXA Bitten or stung by nonvenomous insect and other nonvenomous arthropods, initial encounter: Secondary | ICD-10-CM | POA: Diagnosis not present

## 2016-01-21 DIAGNOSIS — A799 Rickettsiosis, unspecified: Secondary | ICD-10-CM | POA: Diagnosis not present

## 2016-02-05 DIAGNOSIS — J302 Other seasonal allergic rhinitis: Secondary | ICD-10-CM | POA: Diagnosis not present

## 2016-02-05 DIAGNOSIS — K580 Irritable bowel syndrome with diarrhea: Secondary | ICD-10-CM | POA: Diagnosis not present

## 2016-02-05 DIAGNOSIS — M545 Low back pain: Secondary | ICD-10-CM | POA: Diagnosis not present

## 2016-02-05 DIAGNOSIS — R110 Nausea: Secondary | ICD-10-CM | POA: Diagnosis not present

## 2016-02-05 DIAGNOSIS — M797 Fibromyalgia: Secondary | ICD-10-CM | POA: Diagnosis not present

## 2016-02-05 DIAGNOSIS — M542 Cervicalgia: Secondary | ICD-10-CM | POA: Diagnosis not present

## 2016-02-05 DIAGNOSIS — G43709 Chronic migraine without aura, not intractable, without status migrainosus: Secondary | ICD-10-CM | POA: Diagnosis not present

## 2016-02-05 DIAGNOSIS — K219 Gastro-esophageal reflux disease without esophagitis: Secondary | ICD-10-CM | POA: Diagnosis not present

## 2016-02-05 DIAGNOSIS — M532X8 Spinal instabilities, sacral and sacrococcygeal region: Secondary | ICD-10-CM | POA: Diagnosis not present

## 2016-05-09 DIAGNOSIS — K219 Gastro-esophageal reflux disease without esophagitis: Secondary | ICD-10-CM | POA: Diagnosis not present

## 2016-05-09 DIAGNOSIS — M545 Low back pain: Secondary | ICD-10-CM | POA: Diagnosis not present

## 2016-05-09 DIAGNOSIS — K580 Irritable bowel syndrome with diarrhea: Secondary | ICD-10-CM | POA: Diagnosis not present

## 2016-05-09 DIAGNOSIS — G43709 Chronic migraine without aura, not intractable, without status migrainosus: Secondary | ICD-10-CM | POA: Diagnosis not present

## 2016-05-09 DIAGNOSIS — M542 Cervicalgia: Secondary | ICD-10-CM | POA: Diagnosis not present

## 2016-05-09 DIAGNOSIS — M797 Fibromyalgia: Secondary | ICD-10-CM | POA: Diagnosis not present

## 2016-05-28 IMAGING — CR DG CHEST 1V PORT
2 series · 2 of 2 positions shown · non-contrast
Comparison: Chest x-ray 02/18/2014.

CLINICAL DATA: Chest pain.

EXAM:
PORTABLE CHEST - 1 VIEW

[AP (1 of 2)]
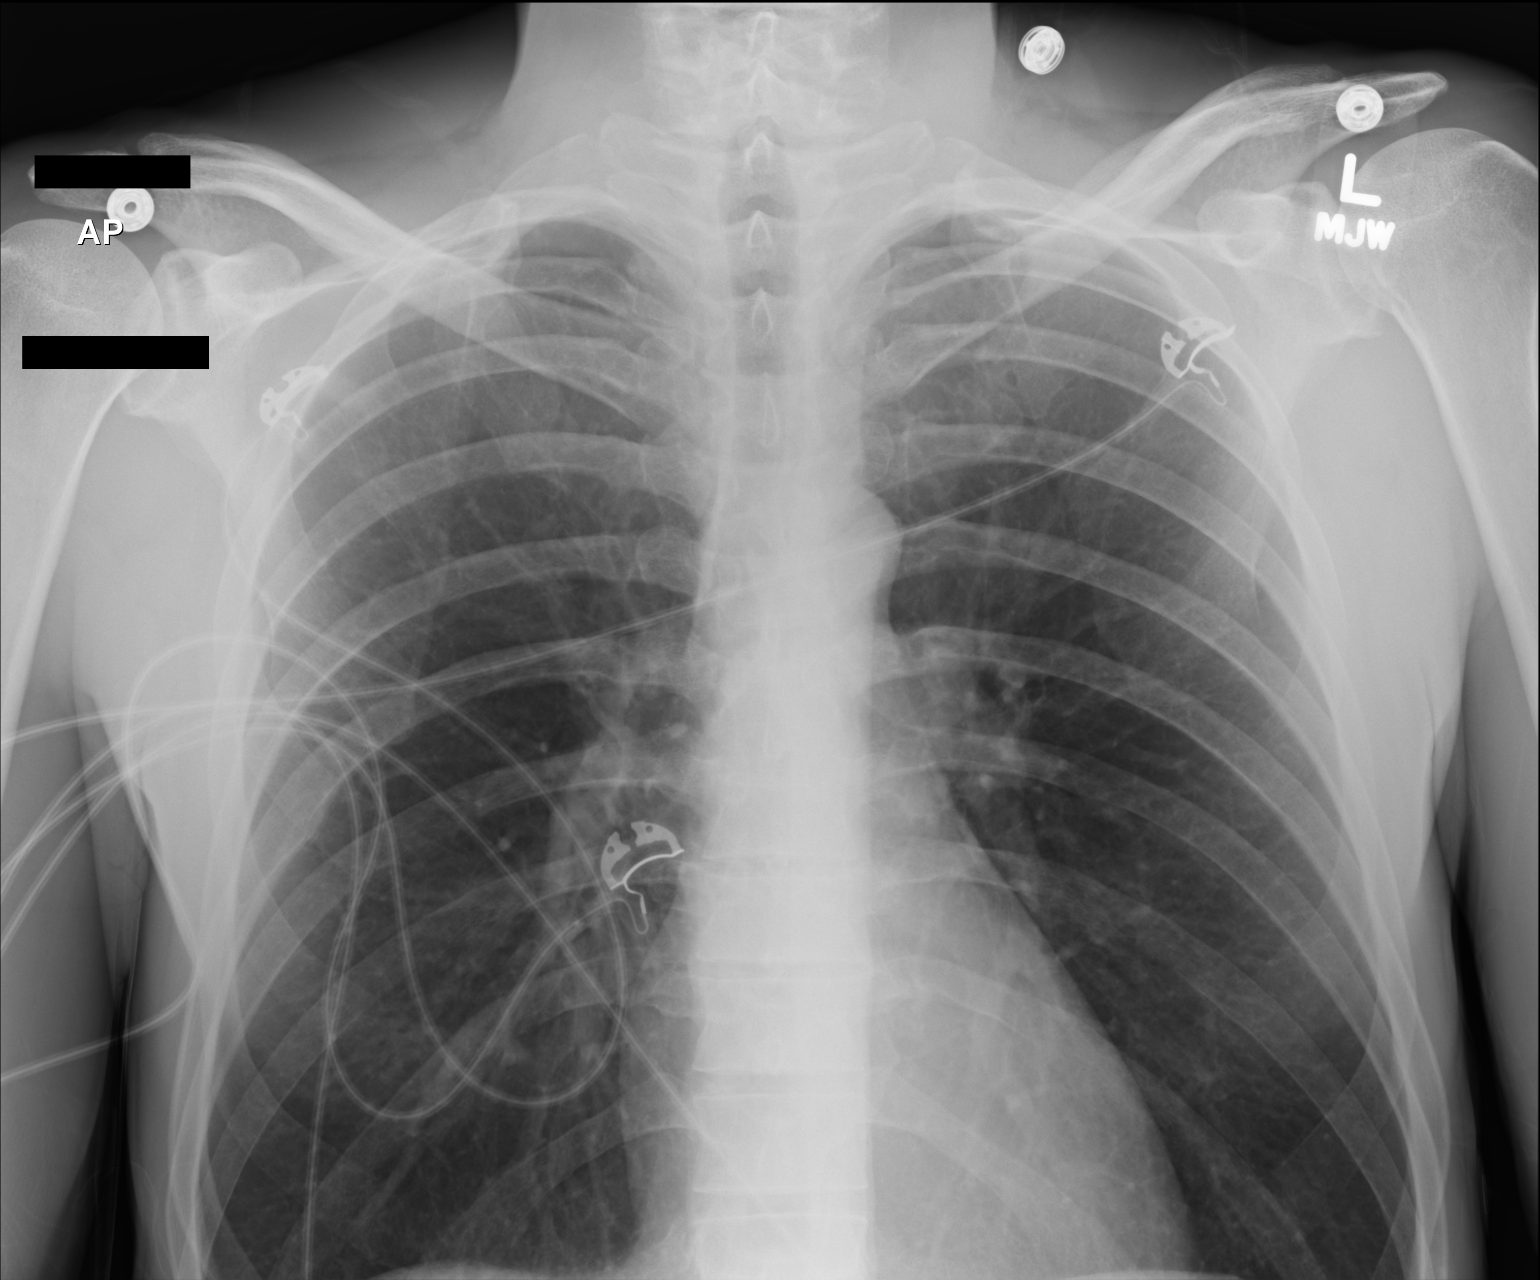

[AP (2 of 2)]
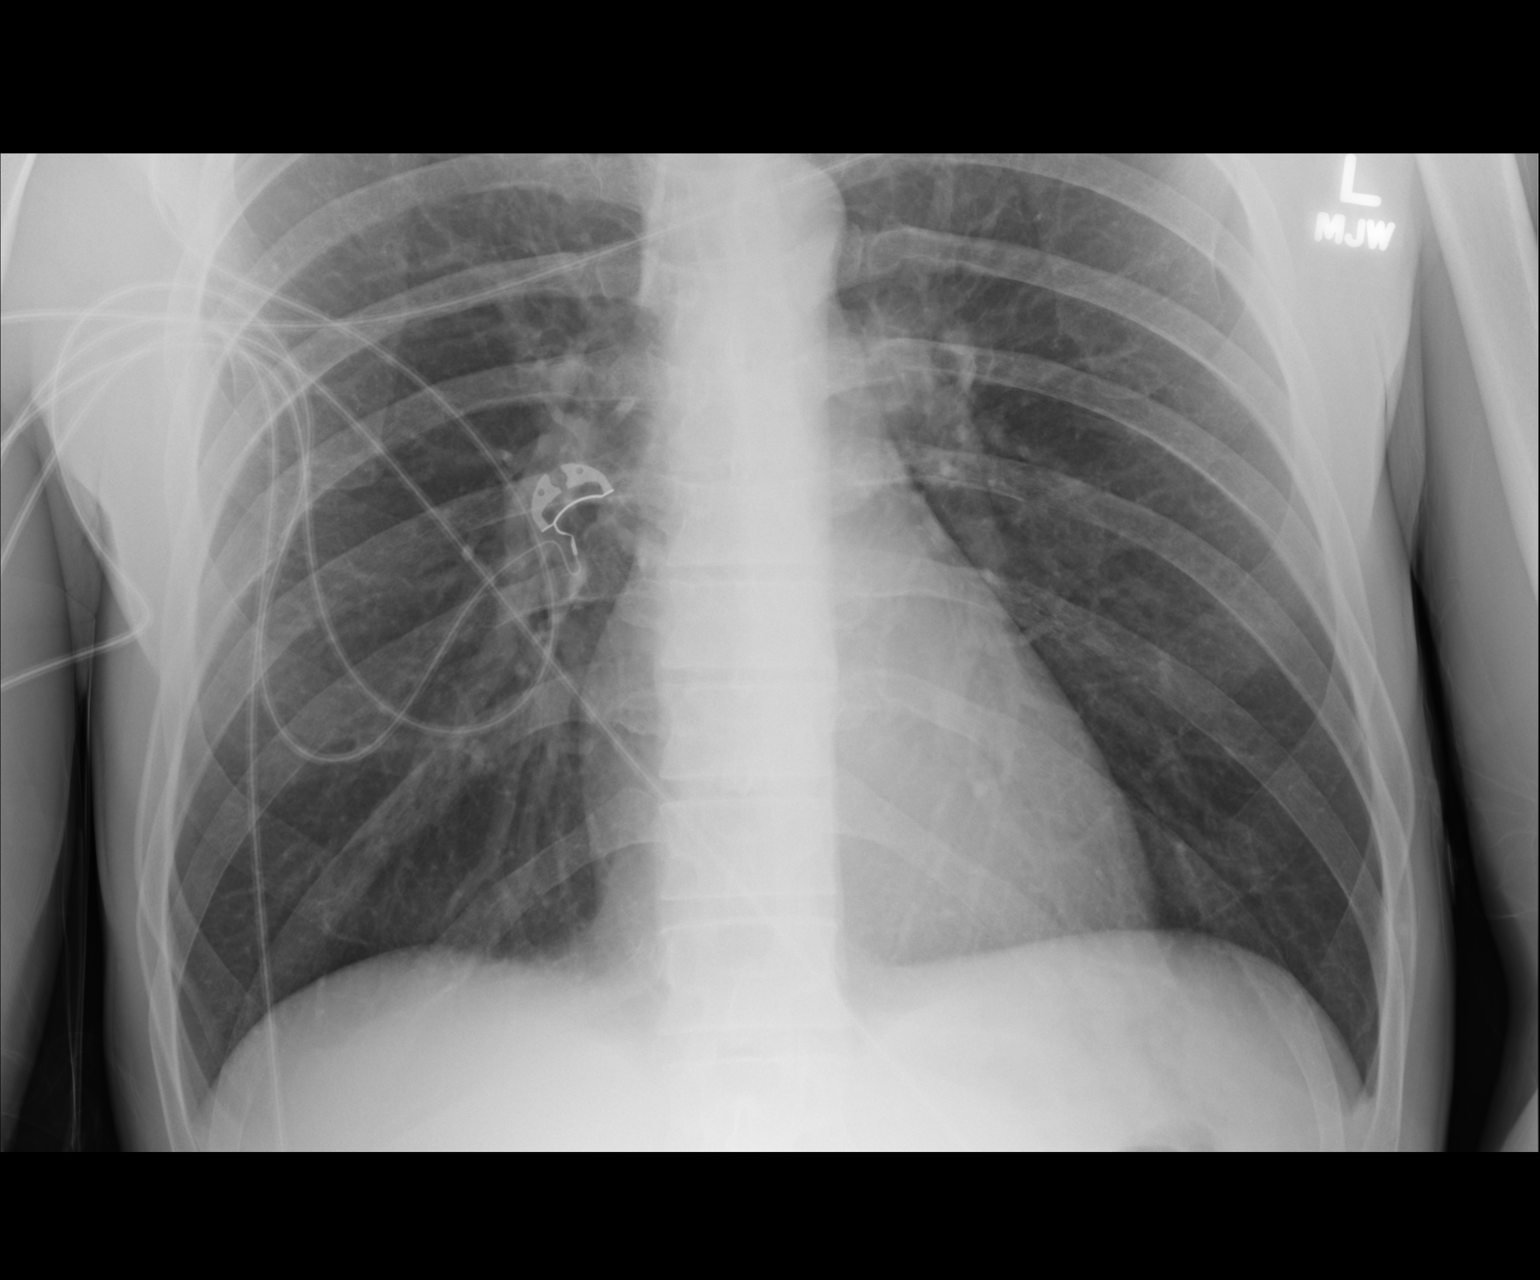

[2 of 2 positions shown; findings below may reference images not displayed]

FINDINGS: Lung volumes are normal. No consolidative airspace disease. No
pleural effusions. No pneumothorax. No pulmonary nodule or mass
noted. Pulmonary vasculature and the cardiomediastinal silhouette
are within normal limits.
IMPRESSION: No radiographic evidence of acute cardiopulmonary disease.

## 2016-06-25 DIAGNOSIS — M791 Myalgia: Secondary | ICD-10-CM | POA: Diagnosis not present

## 2016-06-25 DIAGNOSIS — R531 Weakness: Secondary | ICD-10-CM | POA: Diagnosis not present

## 2016-06-25 DIAGNOSIS — M79604 Pain in right leg: Secondary | ICD-10-CM | POA: Diagnosis not present

## 2016-06-25 DIAGNOSIS — M79605 Pain in left leg: Secondary | ICD-10-CM | POA: Diagnosis not present

## 2016-06-25 DIAGNOSIS — R109 Unspecified abdominal pain: Secondary | ICD-10-CM | POA: Diagnosis not present

## 2016-06-25 DIAGNOSIS — R197 Diarrhea, unspecified: Secondary | ICD-10-CM | POA: Diagnosis not present

## 2016-06-25 DIAGNOSIS — R110 Nausea: Secondary | ICD-10-CM | POA: Diagnosis not present

## 2016-06-25 DIAGNOSIS — Z87820 Personal history of traumatic brain injury: Secondary | ICD-10-CM | POA: Diagnosis not present

## 2016-06-25 DIAGNOSIS — R509 Fever, unspecified: Secondary | ICD-10-CM | POA: Diagnosis not present

## 2016-06-29 DIAGNOSIS — K219 Gastro-esophageal reflux disease without esophagitis: Secondary | ICD-10-CM | POA: Diagnosis not present

## 2016-06-29 DIAGNOSIS — M542 Cervicalgia: Secondary | ICD-10-CM | POA: Diagnosis not present

## 2016-06-29 DIAGNOSIS — M797 Fibromyalgia: Secondary | ICD-10-CM | POA: Diagnosis not present

## 2016-06-29 DIAGNOSIS — M545 Low back pain: Secondary | ICD-10-CM | POA: Diagnosis not present

## 2016-08-08 DIAGNOSIS — M797 Fibromyalgia: Secondary | ICD-10-CM | POA: Diagnosis not present

## 2016-08-08 DIAGNOSIS — K219 Gastro-esophageal reflux disease without esophagitis: Secondary | ICD-10-CM | POA: Diagnosis not present

## 2016-08-08 DIAGNOSIS — R221 Localized swelling, mass and lump, neck: Secondary | ICD-10-CM | POA: Diagnosis not present

## 2016-08-08 DIAGNOSIS — D492 Neoplasm of unspecified behavior of bone, soft tissue, and skin: Secondary | ICD-10-CM | POA: Diagnosis not present

## 2016-08-08 DIAGNOSIS — M542 Cervicalgia: Secondary | ICD-10-CM | POA: Diagnosis not present

## 2016-08-08 DIAGNOSIS — M545 Low back pain: Secondary | ICD-10-CM | POA: Diagnosis not present

## 2016-08-08 DIAGNOSIS — G43709 Chronic migraine without aura, not intractable, without status migrainosus: Secondary | ICD-10-CM | POA: Diagnosis not present

## 2016-11-07 DIAGNOSIS — G43709 Chronic migraine without aura, not intractable, without status migrainosus: Secondary | ICD-10-CM | POA: Diagnosis not present

## 2016-11-07 DIAGNOSIS — K580 Irritable bowel syndrome with diarrhea: Secondary | ICD-10-CM | POA: Diagnosis not present

## 2016-11-07 DIAGNOSIS — K219 Gastro-esophageal reflux disease without esophagitis: Secondary | ICD-10-CM | POA: Diagnosis not present

## 2016-11-07 DIAGNOSIS — M545 Low back pain: Secondary | ICD-10-CM | POA: Diagnosis not present

## 2016-11-07 DIAGNOSIS — M797 Fibromyalgia: Secondary | ICD-10-CM | POA: Diagnosis not present

## 2017-02-08 DIAGNOSIS — M797 Fibromyalgia: Secondary | ICD-10-CM | POA: Diagnosis not present

## 2017-02-08 DIAGNOSIS — G43709 Chronic migraine without aura, not intractable, without status migrainosus: Secondary | ICD-10-CM | POA: Diagnosis not present

## 2017-02-08 DIAGNOSIS — M545 Low back pain: Secondary | ICD-10-CM | POA: Diagnosis not present

## 2017-02-08 DIAGNOSIS — M542 Cervicalgia: Secondary | ICD-10-CM | POA: Diagnosis not present

## 2017-02-08 DIAGNOSIS — K219 Gastro-esophageal reflux disease without esophagitis: Secondary | ICD-10-CM | POA: Diagnosis not present

## 2017-06-28 DIAGNOSIS — Z1211 Encounter for screening for malignant neoplasm of colon: Secondary | ICD-10-CM | POA: Diagnosis not present

## 2017-06-28 DIAGNOSIS — Z0000 Encounter for general adult medical examination without abnormal findings: Secondary | ICD-10-CM | POA: Diagnosis not present

## 2017-06-28 DIAGNOSIS — M542 Cervicalgia: Secondary | ICD-10-CM | POA: Diagnosis not present

## 2017-06-28 DIAGNOSIS — K219 Gastro-esophageal reflux disease without esophagitis: Secondary | ICD-10-CM | POA: Diagnosis not present

## 2017-06-28 DIAGNOSIS — M797 Fibromyalgia: Secondary | ICD-10-CM | POA: Diagnosis not present

## 2017-06-28 DIAGNOSIS — Z5181 Encounter for therapeutic drug level monitoring: Secondary | ICD-10-CM | POA: Diagnosis not present

## 2017-06-28 DIAGNOSIS — G43709 Chronic migraine without aura, not intractable, without status migrainosus: Secondary | ICD-10-CM | POA: Diagnosis not present

## 2017-06-28 DIAGNOSIS — M545 Low back pain: Secondary | ICD-10-CM | POA: Diagnosis not present

## 2017-10-18 ENCOUNTER — Other Ambulatory Visit: Payer: Self-pay

## 2017-10-18 ENCOUNTER — Ambulatory Visit
Admission: EM | Admit: 2017-10-18 | Discharge: 2017-10-18 | Disposition: A | Discharge status: Home / Self Care | Payer: No Typology Code available for payment source | Attending: Family Medicine | Admitting: Family Medicine

## 2017-10-18 DIAGNOSIS — R110 Nausea: Secondary | ICD-10-CM

## 2017-10-18 DIAGNOSIS — B719 Cestode infection, unspecified: Secondary | ICD-10-CM | POA: Diagnosis not present

## 2017-10-18 HISTORY — DX: Spondylolisthesis, site unspecified: M43.10

## 2017-10-18 MED ORDER — ONDANSETRON 8 MG PO TBDP: 8.0000 mg | ORAL_TABLET | Freq: Three times a day (TID) | ORAL | 0 refills | Status: DC | PRN

## 2017-10-18 MED ORDER — PRAZIQUANTEL 600 MG PO TABS: ORAL_TABLET | ORAL | 0 refills | Status: DC

## 2017-10-18 NOTE — ED Provider Notes (Signed)
[facility]    CSN: [id number] Arrival date & time: 10/18/17  1048     History   Chief Complaint Chief Complaint  Patient presents with  . Abdominal Pain    HPI Andres Ray is a 47 y.o. male.   47 yo male with a c/o mild abdominal pain for one week associated with nausea and a tapeworm found in his stool last night. States he had recently treated his dogs for tapeworm. Denies any fevers, chills, melena, hematochezia, vomiting.    The history is provided by the patient.    Past Medical History:  Diagnosis Date  . Degenerative disc disease, cervical   . Migraines   . Spondylisthesis     There are no active problems to display for this patient.   History reviewed. No pertinent surgical history.     Home Medications    Prior to Admission medications   Medication Sig Start Date End Date Taking? Authorizing Provider  acetaminophen (TYLENOL) 325 MG tablet Take 650 mg by mouth every 6 (six) hours as needed for mild pain, fever or headache.    [provider]  diazepam (VALIUM) 10 MG tablet Take 10 mg by mouth daily.    [provider]  esomeprazole (NEXIUM) 40 MG capsule Take 40 mg by mouth daily at 12 noon.    [provider]  fentaNYL (DURAGESIC - DOSED MCG/HR) 25 MCG/HR patch Place 25 mcg onto the skin every 3 (three) days.    [provider]  FOLIC ACID PO Take 1 tablet by mouth daily.    [provider]  HYDROcodone-acetaminophen (NORCO/VICODIN) 5-325 MG per tablet Take 1-2 tablets by mouth every 6 (six) hours as needed for moderate pain or severe pain. 04/02/14   Cleatrice Burke, PA-C  Methotrexate Sodium, PF, 50 MG/2ML SOLN Inject 0.4 mLs as directed every Friday.    [provider]  Olopatadine HCl (PATANASE) 0.6 % SOLN Place 1 drop into the nose daily as needed (allergies).    [provider]  ondansetron (ZOFRAN ODT) 8 MG disintegrating tablet Take 1 tablet (8 mg total) by mouth every 8  (eight) hours as needed. 10/18/17   Andres Gable, MD  ondansetron (ZOFRAN) 4 MG tablet Take 1 tablet (4 mg total) by mouth every 6 (six) hours. 04/02/14   Cleatrice Burke, PA-C  praziquantel (BILTRICIDE) 600 MG tablet Take one tablet po once 10/18/17   Andres Gable, MD  promethazine (PHENERGAN) 25 MG tablet Take 25 mg by mouth every 6 (six) hours as needed for vomiting (nausea due to migraine).    [provider]  PROMETHAZINE HCL RE Place 1 suppository rectally every 8 (eight) hours as needed (nausea due to migraine).    [provider]  SUMAtriptan Succinate Refill (IMITREX STATDOSE REFILL) 6 MG/0.5ML SOCT Inject 6 mg into the skin daily as needed (migraines).     [provider]  tiZANidine (ZANAFLEX) 4 MG tablet Take 4 mg by mouth 2 (two) times daily.    [provider]  vitamin D, CHOLECALCIFEROL, 400 UNITS tablet Take 400 Units by mouth daily.    [provider]    Family History History reviewed. No pertinent family history.  Social History Social History   Tobacco Use  . Smoking status: Former Research scientist (life sciences)  . Smokeless tobacco: Never Used  Substance Use Topics  . Alcohol use: No  . Drug use: No     Allergies   Solu-medrol [methylprednisolone acetate]   Review of Systems  Review of Systems   Physical Exam Triage Vital Signs ED Triage Vitals  Enc Vitals Group     BP 10/18/17 1112 128/75     Pulse Rate 10/18/17 1112 67     Resp 10/18/17 1112 18     Temp 10/18/17 1112 98.4 F (36.9 C)     Temp Source 10/18/17 1112 Oral     SpO2 10/18/17 1112 100 %     Weight 10/18/17 1113 170 lb (77.1 kg)     Height 10/18/17 1113 6' (1.829 m)     Head Circumference --      Peak Flow --      Pain Score 10/18/17 1113 5     Pain Loc --      Pain Edu? --      Excl. in [place]? --    No data found.  Updated Vital Signs BP 128/75 (BP Location: Right Arm)   Pulse 67   Temp 98.4 F (36.9 C) (Oral)   Resp 18   Ht 6' (1.829 m)   Wt 170 lb (77.1  kg)   SpO2 100%   BMI 23.06 kg/m   Visual Acuity Right Eye Distance:   Left Eye Distance:   Bilateral Distance:    Right Eye Near:   Left Eye Near:    Bilateral Near:     Physical Exam  Constitutional: He is oriented to person, place, and time. He appears well-developed and well-nourished. No distress.  HENT:  Head: Normocephalic and atraumatic.  Cardiovascular: Normal rate, regular rhythm, normal heart sounds and intact distal pulses.  No murmur heard. Pulmonary/Chest: Effort normal and breath sounds normal. No respiratory distress. He has no wheezes. He has no rales.  Abdominal: Soft. Bowel sounds are normal. He exhibits no distension and no mass. There is tenderness (mild; diffuse). There is no rebound and no guarding.  Neurological: He is alert and oriented to person, place, and time.  Skin: No rash noted. He is not diaphoretic.  Nursing note and vitals reviewed.    UC Treatments / Results  Labs (all labs ordered are listed, but only abnormal results are displayed) Labs Reviewed - No data to display  EKG  EKG Interpretation None       Radiology No results found.  Procedures Procedures (including critical care time)  Medications Ordered in UC Medications - No data to display   Initial Impression / Assessment and Plan / UC Course  I have reviewed the triage vital signs and the nursing notes.  Pertinent labs & imaging results that were available during my care of the patient were reviewed by me and considered in my medical decision making (see chart for details).       Final Clinical Impressions(s) / UC Diagnoses   Final diagnoses:  Tapeworm infection  Nausea without vomiting    ED Discharge Orders        Ordered    praziquantel (BILTRICIDE) 600 MG tablet     10/18/17 1214    ondansetron (ZOFRAN ODT) 8 MG disintegrating tablet  Every 8 hours PRN     10/18/17 1214     1. diagnosis reviewed with patient 2. rx as per orders above; reviewed  possible side effects, interactions, risks and benefits  3. Recommend supportive treatment with increased fluids 4. Follow-up prn if symptoms worsen or don't improve  Controlled Substance Prescriptions [place] Controlled Substance Registry consulted? Not Applicable   Andres Gable, MD 10/18/17 [phone]

## 2017-10-18 NOTE — ED Triage Notes (Signed)
Headache pressure x past 2 months. Abd pain started one week ago accompanied by nausea and "I think I saw a tapeworm in my stool."

## 2019-01-29 ENCOUNTER — Ambulatory Visit
Admission: EM | Admit: 2019-01-29 | Discharge: 2019-01-29 | Disposition: A | Discharge status: Home / Self Care | Payer: No Typology Code available for payment source | Attending: Urgent Care | Admitting: Urgent Care

## 2019-01-29 ENCOUNTER — Other Ambulatory Visit: Payer: Self-pay

## 2019-01-29 DIAGNOSIS — M5441 Lumbago with sciatica, right side: Secondary | ICD-10-CM

## 2019-01-29 MED ORDER — METHYLPREDNISOLONE 4 MG PO TBPK: ORAL_TABLET | ORAL | 0 refills | Status: AC

## 2019-01-29 MED ORDER — TRAMADOL HCL 50 MG PO TABS: 50.0000 mg | ORAL_TABLET | Freq: Two times a day (BID) | ORAL | 0 refills | Status: AC | PRN

## 2019-01-29 MED ORDER — NAPROXEN 500 MG PO TABS: 500.0000 mg | ORAL_TABLET | Freq: Two times a day (BID) | ORAL | 0 refills | Status: AC
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
## 2019-01-29 NOTE — Discharge Instructions (Addendum)
It was very nice meeting you today in clinic. Thank you for entrusting me with your care.   As discussed, you will likely need imaging of your back if this does not help with your pain. Please utilize the medications that we discussed. Your prescriptions have been called in to your pharmacy. Apply moist heat at least 3 times a day for 15 minutes at a time.   Make arrangements to follow up with neurosurgery in 1 week if not better to discuss further evaluation. If your symptoms/condition worsens, please seek follow up care either here or in the ER. Please remember, our [place] providers are "right here with you" when you need [place].   Again, it was my pleasure to take care of you today. Thank you for choosing our clinic. I hope that you start to feel better quickly.   Honor Loh, MSN, APRN, FNP-C, CEN Advanced Practice Provider [facility]

## 2019-02-27 ENCOUNTER — Other Ambulatory Visit: Payer: Self-pay | Admitting: Student

## 2019-02-27 DIAGNOSIS — M542 Cervicalgia: Secondary | ICD-10-CM

## 2019-03-08 ENCOUNTER — Ambulatory Visit: Payer: No Typology Code available for payment source

## 2020-08-17 ENCOUNTER — Other Ambulatory Visit: Payer: Self-pay | Admitting: Family

## 2020-10-06 ENCOUNTER — Other Ambulatory Visit: Payer: Self-pay | Admitting: Orthopedic Surgery

## 2020-11-28 ENCOUNTER — Other Ambulatory Visit: Payer: Self-pay

## 2020-11-30 ENCOUNTER — Other Ambulatory Visit: Payer: Self-pay

## 2020-12-01 ENCOUNTER — Other Ambulatory Visit: Payer: Self-pay

## 2020-12-01 MED ORDER — SUMATRIPTAN SUCCINATE 6 MG/0.5ML ~~LOC~~ SOAJ
SUBCUTANEOUS | 3 refills | Status: AC
  Filled 2020-12-01 – 2020-12-29 (×4): qty 3, 30d supply, fill #0

## 2020-12-18 ENCOUNTER — Other Ambulatory Visit: Payer: Self-pay

## 2020-12-21 ENCOUNTER — Other Ambulatory Visit: Payer: Self-pay

## 2020-12-23 ENCOUNTER — Other Ambulatory Visit: Payer: Self-pay

## 2020-12-23 MED ORDER — SUMATRIPTAN SUCCINATE 6 MG/0.5ML ~~LOC~~ SOAJ
SUBCUTANEOUS | 0 refills | Status: AC
  Filled 2020-12-23: qty 6, 90d supply, fill #0

## 2020-12-24 ENCOUNTER — Other Ambulatory Visit: Payer: Self-pay

## 2020-12-29 ENCOUNTER — Other Ambulatory Visit: Payer: Self-pay

## 2020-12-30 ENCOUNTER — Other Ambulatory Visit: Payer: Self-pay

## 2021-01-01 ENCOUNTER — Other Ambulatory Visit: Payer: Self-pay

## 2021-06-25 ENCOUNTER — Other Ambulatory Visit: Payer: Self-pay

## 2021-08-26 ENCOUNTER — Other Ambulatory Visit: Payer: Self-pay

## 2021-08-26 MED ORDER — SUMATRIPTAN SUCCINATE 6 MG/0.5ML ~~LOC~~ SOAJ
SUBCUTANEOUS | 1 refills | Status: AC
  Filled 2021-08-26: qty 1, 1d supply, fill #0
  Filled 2021-08-26: qty 2, 30d supply, fill #0
  Filled 2021-08-30: qty 1, 8d supply, fill #0
  Filled 2021-08-30: qty 1, 10d supply, fill #0

## 2021-08-30 ENCOUNTER — Other Ambulatory Visit: Payer: Self-pay

## 2021-08-31 ENCOUNTER — Other Ambulatory Visit: Payer: Self-pay

## 2021-08-31 MED ORDER — SUMATRIPTAN SUCCINATE 6 MG/0.5ML ~~LOC~~ SOLN
SUBCUTANEOUS | 2 refills | Status: AC
  Filled 2021-08-31: qty 5, 28d supply, fill #0

## 2021-09-01 ENCOUNTER — Other Ambulatory Visit: Payer: Self-pay

## 2021-09-27 ENCOUNTER — Other Ambulatory Visit: Payer: Self-pay

## 2021-12-06 ENCOUNTER — Other Ambulatory Visit (HOSPITAL_COMMUNITY): Payer: Self-pay

## 2021-12-27 ENCOUNTER — Other Ambulatory Visit (HOSPITAL_COMMUNITY): Payer: Self-pay

## 2021-12-27 ENCOUNTER — Other Ambulatory Visit: Payer: Self-pay

## 2022-06-07 ENCOUNTER — Other Ambulatory Visit: Payer: Self-pay

## 2022-06-09 ENCOUNTER — Other Ambulatory Visit: Payer: Self-pay

## 2022-06-15 ENCOUNTER — Other Ambulatory Visit: Payer: Self-pay

## 2022-06-19 ENCOUNTER — Other Ambulatory Visit: Payer: Self-pay

## 2023-02-14 ENCOUNTER — Other Ambulatory Visit: Payer: Self-pay

## 2023-02-14 MED ORDER — SUMATRIPTAN SUCCINATE 6 MG/0.5ML ~~LOC~~ SOAJ
0.5000 mL | SUBCUTANEOUS | 2 refills | Status: AC
  Filled 2023-02-14: qty 10, 30d supply, fill #0

## 2023-02-15 ENCOUNTER — Other Ambulatory Visit: Payer: Self-pay
